# Patient Record
Sex: Male | Born: 1985 | Race: White | Hispanic: No | Marital: Single | Smoking: Never smoker
Health system: Southern US, Community
[De-identification: ages and names within clinical notes are randomized; demographics above are authoritative.]

## PROBLEM LIST (undated history)

## (undated) DIAGNOSIS — F329 Major depressive disorder, single episode, unspecified: Secondary | ICD-10-CM

## (undated) DIAGNOSIS — F419 Anxiety disorder, unspecified: Secondary | ICD-10-CM

## (undated) DIAGNOSIS — K219 Gastro-esophageal reflux disease without esophagitis: Secondary | ICD-10-CM

## (undated) DIAGNOSIS — F32A Depression, unspecified: Secondary | ICD-10-CM

## (undated) HISTORY — DX: Anxiety disorder, unspecified: F41.9

## (undated) HISTORY — DX: Major depressive disorder, single episode, unspecified: F32.9

## (undated) HISTORY — DX: Depression, unspecified: F32.A

## (undated) HISTORY — DX: Gastro-esophageal reflux disease without esophagitis: K21.9

---

## 2018-06-13 ENCOUNTER — Encounter: Payer: Self-pay | Admitting: Medical

## 2018-06-13 ENCOUNTER — Ambulatory Visit (INDEPENDENT_AMBULATORY_CARE_PROVIDER_SITE_OTHER): Payer: BLUE CROSS/BLUE SHIELD | Admitting: Medical

## 2018-06-13 VITALS — BP 140/70 | HR 75 | Temp 98.2°F | Resp 16 | Ht 66.0 in | Wt 135.4 lb

## 2018-06-13 DIAGNOSIS — F329 Major depressive disorder, single episode, unspecified: Secondary | ICD-10-CM

## 2018-06-13 DIAGNOSIS — F32A Depression, unspecified: Secondary | ICD-10-CM

## 2018-06-13 DIAGNOSIS — F419 Anxiety disorder, unspecified: Secondary | ICD-10-CM

## 2018-06-13 DIAGNOSIS — K219 Gastro-esophageal reflux disease without esophagitis: Secondary | ICD-10-CM | POA: Diagnosis not present

## 2018-06-13 MED ORDER — VENLAFAXINE HCL ER 37.5 MG PO CP24: 37.5000 mg | ORAL_CAPSULE | Freq: Every day | ORAL | 0 refills | Status: DC

## 2018-06-13 MED ORDER — BUSPIRONE HCL 15 MG PO TABS: 15.0000 mg | ORAL_TABLET | Freq: Two times a day (BID) | ORAL | 0 refills | Status: DC

## 2018-06-13 NOTE — Progress Notes (Signed)
Subjective:    Patient ID: Patrick Fernandez, male    DOB: July 30, 1986, 32 y.o.   MRN: [id number]  HPI  Pt in with friend of his. This if first.  Works at [employer], Does not exercise regularly. Pt 3-4 sodas a day. Non smoker. Pt admits not eating healthy.   Pt does not want flu vaccine.   Pt and his friend admit that he is extremely stressed, anxious and he is likely depressed. He used zoloft in the past in 2003. He used it very briefly and it did not help. He lost insurance years ago and had to stop. He states baseline always anxious but more constant high level past 2 months with new supervisor.  On review he does feel throat tighten when anxious but no allergy type symptoms associated on review.     Review of Systems  Constitutional: Negative for chills, fatigue and fever.  HENT:       Pt had two episodes of feeling like he had tight throat randomly. He thought might be related to stress. He has not been sob or wheezing.  Respiratory: Negative for cough, chest tightness, shortness of breath and wheezing.   Cardiovascular: Negative for chest pain and palpitations.  Gastrointestinal: Positive for abdominal pain.       Pt does have history of heart burn occasionally in the past. In past when eats would feel like something would come up in throat.  He is now on prilosec otc daily.  Genitourinary: Negative for dysuria.  Musculoskeletal: Negative for back pain.  Skin: Negative for rash.  Neurological: Negative for dizziness, speech difficulty, weakness, light-headedness, numbness and headaches.  Hematological: Negative for adenopathy. Does not bruise/bleed easily.  Psychiatric/Behavioral: Positive for dysphoric mood. Negative for behavioral problems, confusion, sleep disturbance and suicidal ideas. The patient is nervous/anxious.        Some stress at work. He worries about everything per friend.  Friend states that he does suffer from depression.   He admits that work stress is extreme  stress. 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  He states when he gets anxiety will feel like frog in his throat.   Past Medical History:  Diagnosis Date  . Anxiety   . Depression      Social History   Socioeconomic History  . Marital status: Single    Spouse name: Not on file  . Number of children: Not on file  . Years of education: Not on file  . Highest education level: Not on file  Occupational History  . Not on file  Social Needs  . Financial resource strain: Not on file  . Food insecurity:    Worry: Not on file    Inability: Not on file  . Transportation needs:    Medical: Not on file    Non-medical: Not on file  Tobacco Use  . Smoking status: Never Smoker  . Smokeless tobacco: Never Used  Substance and Sexual Activity  . Alcohol use: Never    Frequency: Never  . Drug use: Never  . Sexual activity: Not on file  Lifestyle  . Physical activity:    Days per week: Not on file    Minutes per session: Not on file  . Stress: Not on file  Relationships  . Social connections:    Talks on phone: Not on file    Gets together: Not on file    Attends religious service: Not on file    Active member of club or  organization: Not on file    Attends meetings of clubs or organizations: Not on file    Relationship status: Not on file  . Intimate partner violence:    Fear of current or ex partner: Not on file    Emotionally abused: Not on file    Physically abused: Not on file    Forced sexual activity: Not on file  Other Topics Concern  . Not on file  Social History Narrative  . Not on file      Family History  Problem Relation Age of Onset  . Stroke Mother   . Colon cancer Father   . Hypertension Father     Not on File  No current outpatient medications on file prior to visit.   No current facility-administered medications on file prior to visit.     BP (!) 154/100   Pulse 75   Temp 98.2 F (36.8 C) (Oral)   Resp 16   Ht 5\' 6"  (1.676 m)    Wt 135 lb 6.4 oz (61.4 kg)   SpO2 100%   BMI 21.85 kg/m       Objective:   Physical Exam  General Mental Status- Alert. General Appearance- Not in acute distress.   Skin General: Color- Normal Color. Moisture- Normal Moisture.  Neck Carotid Arteries- Normal color. Moisture- Normal Moisture. No carotid bruits. No JVD.  Chest and Lung Exam Auscultation: Breath Sounds:-Normal.  Cardiovascular Auscultation:Rythm- Regular. Murmurs & Other Heart Sounds:Auscultation of the heart reveals- No Murmurs.  Abdomen Inspection:-Inspeection Normal. Palpation/Percussion:Note:No mass. Palpation and Percussion of the abdomen reveal- Non Tender, Non Distended + BS, no rebound or guarding.    Neurologic Cranial Nerve exam:- CN III-XII intact(No nystagmus), symmetric smile. Strength:- 5/5 equal and symmetric strength both upper and lower extremities.      Assessment & Plan:  For anxiety and depression, I am prescribing effexor and making buspar available Recommend that you start with effexor and hold off on taking buspar initially. If still feeling some anxiety after a week can add buspar.  For gerd continue omeprazole. Recommend healthy diet and cutting back on sodas.  I don't think you are having allergy type symptoms/reaction but if you do get severe throat tighten sensation then recommend ED evaluation.  Follow up 12-14 days CPE or as needed  [employer] [employer]

## 2018-06-13 NOTE — Patient Instructions (Signed)
For anxiety and depression, I am prescribing effexor and making buspar available Recommend that you start with effexor and hold off on taking buspar initially. If still feeling some anxiety after a week can add buspar.  For gerd continue omeprazole. Recommend healthy diet and cutting back on sodas.  I don't think you are having allergy type symptoms/reaction but if you do get severe throat tighten sensation then recommend ED evaluation.  Follow up 12-14 days CPE or as needed

## 2018-06-16 ENCOUNTER — Ambulatory Visit: Payer: Self-pay

## 2018-06-16 NOTE — Telephone Encounter (Signed)
Returned call to pt.  C/o onset of feeling his heart racing and feeling tremors about 3:00 AM Sunday.  Stated he started new medication, Venlafaxine, on Saturday, and feels his symptoms are related to the side effects of this.  Stated he feels like his heart is settling down some at present time; "it's not beating as fast as it was." Requested pt. To check his pulse at this time, but he was unable to locate his pulse.  C/o feeling tremors.  Reported taking the Venlafaxine on Saturday and Sunday AM, but has not taken it this morning.  Denied any chest pain or shortness of breath.  C/o intermittent light-headed feeling, but said "I haven't been eating right for about 2 weeks."  Reported he has difficulty swallowing solid foods due to fear that he will choke.  Stated "the foods don't get stuck, but there is something in my esophagus that is affecting my swallowing."  Denied any difficulty swallowing liquids.  Reported he has panic attacks frequently, and has taken time off work for the next 5 days.  Stated "I don't feel like doing anything, and can only lay around.  Stated even though he is not working, he worries about a lot of other things.  Offered to schedule an appt. For further evaluation of anxiety and increased heart rate.  Pt. stated he will wait to see if Esperanza Richters has any further recommendations, before making another appt.  Care advice given per protocol.  Advised will send Triage note to Esperanza Richters for his review/ recommendations.  Pt. Verb. Understanding.  Agreed with plan.      Reason for Disposition . [1] Palpitations AND [2] no improvement after using CARE ADVICE  Answer Assessment - Initial Assessment Questions 1. DESCRIPTION: "Please describe your heart rate or heart beat that you are having" (e.g., fast/slow, regular/irregular, skipped or extra beats, "palpitations")     Feels like heart is beating fast  2. ONSET: "When did it start?" (Minutes, hours or days)      Sunday AM,  about 3:00 AM 3. DURATION: "How long does it last" (e.g., seconds, minutes, hours)     Varies  4. PATTERN "Does it come and go, or has it been constant since it started?"  "Does it get worse with exertion?"   "Are you feeling it now?"     Comes and goes; feels like it lasts continuously at night   5. TAP: "Using your hand, can you tap out what you are feeling on a chair or table in front of you, so that I can hear?" (Note: not all patients can do this)       Not able to  6. HEART RATE: "Can you tell me your heart rate?" "How many beats in 15 seconds?"  (Note: not all patients can do this)       Unable to find pulse to check it.  7. RECURRENT SYMPTOM: "Have you ever had this before?" If so, ask: "When was the last time?" and "What happened that time?"      "probably in the past when worried about going to work or something but it usually calms itself down.  8. CAUSE: "What do you think is causing the palpitations?"     Feels it is related to starting Venlafaxine 9. CARDIAC HISTORY: "Do you have any history of heart disease?" (e.g., heart attack, angina, bypass surgery, angioplasty, arrhythmia)      Denied any cardiac hx.  10. OTHER SYMPTOMS: "Do you have any  other symptoms?" (e.g., dizziness, chest pain, sweating, difficulty breathing)       C/o some light headedness; hasn't eaten well in 2 weeks due to worry/ anxiety; has panic attacks when he tries to eat due to fear of choking; reported he feels like he breaks out in a sweat with panic attacks;  denied chest pain or shortness of breath; stated he feels shaky intermittently  Protocols used: HEART RATE AND HEARTBEAT QUESTIONS-A-AH  Message from Gerrianne Scale sent at 06/16/2018 8:28 AM EDT   Pt calling stating that he is having side effects from the venlafaxine XR (EFFEXOR-XR) 37.5 MG 24 hr capsule Sunday and this morning pt states that he has been having racing heart and shaking that runs into the afternoon he started taking the medicine on  Saturday and want to know if he need to continue this medicine Call pt at [phone]

## 2018-06-24 ENCOUNTER — Encounter: Payer: Self-pay | Admitting: Medical

## 2018-06-24 ENCOUNTER — Ambulatory Visit (INDEPENDENT_AMBULATORY_CARE_PROVIDER_SITE_OTHER): Payer: BLUE CROSS/BLUE SHIELD | Admitting: Medical

## 2018-06-24 VITALS — BP 148/90 | HR 102 | Temp 98.7°F | Resp 16 | Ht 66.0 in | Wt 136.2 lb

## 2018-06-24 DIAGNOSIS — R000 Tachycardia, unspecified: Secondary | ICD-10-CM

## 2018-06-24 DIAGNOSIS — F419 Anxiety disorder, unspecified: Secondary | ICD-10-CM

## 2018-06-24 DIAGNOSIS — R1310 Dysphagia, unspecified: Secondary | ICD-10-CM

## 2018-06-24 DIAGNOSIS — F329 Major depressive disorder, single episode, unspecified: Secondary | ICD-10-CM | POA: Diagnosis not present

## 2018-06-24 DIAGNOSIS — F32A Depression, unspecified: Secondary | ICD-10-CM

## 2018-06-24 MED ORDER — OMEPRAZOLE 40 MG PO CPDR: 40.0000 mg | DELAYED_RELEASE_CAPSULE | Freq: Every day | ORAL | 0 refills | Status: DC

## 2018-06-24 MED ORDER — SERTRALINE HCL 25 MG PO TABS: 25.0000 mg | ORAL_TABLET | Freq: Every day | ORAL | 0 refills | Status: DC

## 2018-06-24 NOTE — Patient Instructions (Addendum)
Overall you describe your anxiety is still present but not as strong as previously.  Also some persisting depressed mood.  Unfortunately you did describe potential side effects to Effexor.  Effexor does affect to neurotransmitters and would recommend that we switch you to sertraline.  We will start with low-dose and if you feel improved mood with no side effects then consider increasing to a higher dose.  I did prescribe BuSpar last time but hold off presently until we establish how you feel with the sertraline.  You report some pain on swallowing with difficulty swallowing at times with certain foods.  This may be a variant of reflux symptoms.  I am increasing your omeprazole to 40mg  daily.  Rx sent to your pharmacy.  Hopefully with this and healthy diet does swelling symptoms will resolve.  If not we will refer you to GI MD for possible EGD.  For recent fast heart rate sensation event/possible palpitations, we did EKG today. Ekg review appears normal sinus rhythm with no acute abnormality.  Follow-up in 2 weeks or as needed.

## 2018-06-24 NOTE — Progress Notes (Signed)
Subjective:    Patient ID: Patrick Fernandez, male    DOB: [birth year], 32 y.o.   MRN: [id number]  HPI  Pt in for follow up.   Pt states after 2 days of being on effexor he states he felt like his heart was beating to fast. With his anxiety in the past he would get some rare fast heart rate sensation but this was more intense. Pt in the past benadryl would make his heart beat fast. He called and did stop the medication. He states still anxious but not as much as on last visit.  Pt never got buspar  Pt states he feels like he has difficulty swallowing certain foods. He tends to eat soups or salads and avoid larger things such as sub salad. Has to drink thinks to make things go down. This has been going on for about 2-3 weeks. First symptoms occurred after eating candy bar. Had brief difficulty eating Mr Lorie Apley. Pt has been taking the omeprazole.      Review of Systems  Constitutional: Negative for chills, fatigue and fever.  HENT:       See hpi.  Respiratory: Negative for cough, chest tightness, shortness of breath and wheezing.        See hpi.  Cardiovascular: Negative for chest pain and palpitations.       See hpi. No recurrent symptoms for more than a weekl stopped after he stopped effexor.  Gastrointestinal: Negative for abdominal pain.  Genitourinary: Negative for dysuria.  Musculoskeletal: Negative for back pain and neck pain.  Skin: Negative for rash.  Neurological: Negative for dizziness, speech difficulty, weakness and light-headedness.  Hematological: Negative for adenopathy. Does not bruise/bleed easily.  Psychiatric/Behavioral: Negative for behavioral problems, confusion, dysphoric mood and suicidal ideas. The patient is nervous/anxious.     Past Medical History:  Diagnosis Date  . Anxiety   . Depression   . GERD (gastroesophageal reflux disease)      Social History   Socioeconomic History  . Marital status: Single    Spouse name: Not on file  . Number of  children: Not on file  . Years of education: Not on file  . Highest education level: Not on file  Occupational History  . Not on file  Social Needs  . Financial resource strain: Not on file  . Food insecurity:    Worry: Not on file    Inability: Not on file  . Transportation needs:    Medical: Not on file    Non-medical: Not on file  Tobacco Use  . Smoking status: Never Smoker  . Smokeless tobacco: Never Used  Substance and Sexual Activity  . Alcohol use: Never    Frequency: Never  . Drug use: Never  . Sexual activity: Not on file  Lifestyle  . Physical activity:    Days per week: Not on file    Minutes per session: Not on file  . Stress: Not on file  Relationships  . Social connections:    Talks on phone: Not on file    Gets together: Not on file    Attends religious service: Not on file    Active member of club or organization: Not on file    Attends meetings of clubs or organizations: Not on file    Relationship status: Not on file  . Intimate partner violence:    Fear of current or ex partner: Not on file    Emotionally abused: Not on file    Physically abused:  Not on file    Forced sexual activity: Not on file  Other Topics Concern  . Not on file  Social History Narrative  . Not on file    No past surgical history on file.  Family History  Problem Relation Age of Onset  . Stroke Mother   . Colon cancer Father   . Hypertension Father     Not on File  Current Outpatient Medications on File Prior to Visit  Medication Sig Dispense Refill  . busPIRone (BUSPAR) 15 MG tablet Take 1 tablet (15 mg total) by mouth 2 (two) times daily. 60 tablet 0  . venlafaxine XR (EFFEXOR-XR) 37.5 MG 24 hr capsule Take 1 capsule (37.5 mg total) by mouth daily with breakfast. Please give extended release generic 14 capsule 0   No current facility-administered medications on file prior to visit.     BP (!) 148/90   Pulse (!) 102   Temp 98.7 F (37.1 C) (Oral)   Resp 16    Ht 5\' 6"  (1.676 m)   Wt 136 lb 3.2 oz (61.8 kg)   SpO2 100%   BMI 21.98 kg/m       Objective:   Physical Exam  General Mental Status- Alert. General Appearance- Not in acute distress.   Skin General: Color- Normal Color. Moisture- Normal Moisture.  Neck Carotid Arteries- Normal color. Moisture- Normal Moisture. No carotid bruits. No JVD.  Chest and Lung Exam Auscultation: Breath Sounds:-Normal.  Cardiovascular Auscultation:Rythm- Regular. Murmurs & Other Heart Sounds:Auscultation of the heart reveals- No Murmurs.  Abdomen Inspection:-Inspeection Normal. Palpation/Percussion:Note:No mass. Palpation and Percussion of the abdomen reveal- Non Tender, Non Distended + BS, no rebound or guarding.    Neurologic Cranial Nerve exam:- CN III-XII intact(No nystagmus), symmetric smile. Strength:- 5/5 equal and symmetric strength both upper and lower extremities.      Assessment & Plan:  Overall you describe your anxiety is still present but not as strong as previously.  Also some persisting depressed mood.  Unfortunately you did describe potential side effects to Effexor.  Effexor does affect to neurotransmitters and would recommend that we switch you to sertraline.  We will start with low-dose and if you feel improved mood with no side effects then consider increasing to a higher dose.  I did prescribe BuSpar last time but hold off presently until we establish how you feel with the sertraline.  You report some pain on swallowing with difficulty swallowing at times with certain foods.  This may be a variant of reflux symptoms.  I am increasing your omeprazole to 40mg  daily.  Rx sent to your pharmacy.  Hopefully with this and healthy diet does swelling symptoms will resolve.  If not we will refer you to GI MD for possible EGD.  For recent fast heart rate sensation event/possible palpitations, we did EKG today.Ekg review appears normal sinus rhythm with no acute abnormality.(ekg  interpretation mentioned nondiagnostic qrs abnormality in precoridal leads. I don't see obvious concern. Will follow clinically and if palpitation or tachycardia reoccur then refer for holter.  Follow-up in 2 weeks or as needed.  Esperanza Richters, PA-C

## 2018-07-08 ENCOUNTER — Ambulatory Visit (INDEPENDENT_AMBULATORY_CARE_PROVIDER_SITE_OTHER): Payer: BLUE CROSS/BLUE SHIELD | Admitting: Medical

## 2018-07-08 ENCOUNTER — Encounter: Payer: Self-pay | Admitting: Medical

## 2018-07-08 VITALS — BP 139/87 | HR 87 | Temp 98.1°F | Resp 16 | Ht 66.0 in | Wt 136.5 lb

## 2018-07-08 DIAGNOSIS — R000 Tachycardia, unspecified: Secondary | ICD-10-CM | POA: Diagnosis not present

## 2018-07-08 DIAGNOSIS — K219 Gastro-esophageal reflux disease without esophagitis: Secondary | ICD-10-CM | POA: Diagnosis not present

## 2018-07-08 DIAGNOSIS — F419 Anxiety disorder, unspecified: Secondary | ICD-10-CM

## 2018-07-08 MED ORDER — OMEPRAZOLE 40 MG PO CPDR: 40.0000 mg | DELAYED_RELEASE_CAPSULE | Freq: Every day | ORAL | 3 refills | Status: DC

## 2018-07-08 MED ORDER — BUSPIRONE HCL 15 MG PO TABS: 15.0000 mg | ORAL_TABLET | Freq: Two times a day (BID) | ORAL | 0 refills | Status: AC

## 2018-07-08 NOTE — Patient Instructions (Signed)
Your GERD symptoms are much improved with omeprazole.  I did send in refills of that today.  Still recommend eating healthy diet.  If your swallowing complaints are worsening then let me know and I would refer you to gastroenterologist.  If that were to persist then you might need EGD to evaluate size of esophagus.  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  You had side effects with SSRI/norepinephrine type anxiety/depression medication.  It appears that you do not eat anything presently.  However if your anxiety does return then would recommend that you get in with a counselor as medication side effects are limiting treatment options.  You have never tried BuSpar so you might go ahead and fill that prescription and have it on hand to use if needed.  No further recurrent palpitations or tachycardia reported.  This was probably related to anxiety.  Follow-up in 2 months or as needed.

## 2018-07-08 NOTE — Progress Notes (Signed)
Subjective:    Patient ID: Patrick Fernandez, male    DOB: 09-22-1985, 32 y.o.   MRN: [id number]  HPI  Pt in states he had reaction to sertraline. He states he would have very upset stomach. 10-15 minutes of upset stomach. One time felt nausea but he did not vomit.  Effexor in past caused trembling sensation/jittery sensation.  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  Pt never tried the buspirone.  Pt states his stomach is feeling a lot better with omeprazole. No longer having abdomen pain apart from upset stomach with sertraline. He has better appetitive.  Pt mention no recent palpitations or fast heart beat sensations.    Review of Systems  Constitutional: Negative for chills, fatigue and fever.  Respiratory: Negative for cough, chest tightness, shortness of breath and wheezing.   Cardiovascular: Negative for chest pain and palpitations.  Gastrointestinal: Negative for abdominal distention, abdominal pain, blood in stool, constipation and diarrhea.       Better.  Genitourinary: Negative for dysuria and flank pain.  Musculoskeletal: Negative for back pain.  Neurological: Negative for dizziness, seizures, weakness and light-headedness.  Hematological: Negative for adenopathy. Does not bruise/bleed easily.  Psychiatric/Behavioral: Negative for behavioral problems and confusion.    Past Medical History:  Diagnosis Date  . Anxiety   . Depression   . GERD (gastroesophageal reflux disease)      Social History   Socioeconomic History  . Marital status: Single    Spouse name: Not on file  . Number of children: Not on file  . Years of education: Not on file  . Highest education level: Not on file  Occupational History  . Not on file  Social Needs  . Financial resource strain: Not on file  . Food insecurity:    Worry: Not on file    Inability: Not on file  . Transportation needs:   Medical: Not on file    Non-medical: Not on file  Tobacco Use  . Smoking status: Never Smoker  . Smokeless tobacco: Never Used  Substance and Sexual Activity  . Alcohol use: Never    Frequency: Never  . Drug use: Never  . Sexual activity: Not on file  Lifestyle  . Physical activity:    Days per week: Not on file    Minutes per session: Not on file  . Stress: Not on file  Relationships  . Social connections:    Talks on phone: Not on file    Gets together: Not on file    Attends religious service: Not on file    Active member of club or organization: Not on file    Attends meetings of clubs or organizations: Not on file    Relationship status: Not on file  . Intimate partner violence:    Fear of current or ex partner: Not on file    Emotionally abused: Not on file    Physically abused: Not on file    Forced sexual activity: Not on file  Other Topics Concern  . Not on file  Social History Narrative  . Not on file    No past surgical history on file.  Family History  Problem Relation Age of Onset  . Stroke Mother   . Colon cancer Father   . Hypertension Father     No Known Allergies  Current Outpatient Medications on File Prior to Visit  Medication Sig Dispense Refill  .  busPIRone (BUSPAR) 15 MG tablet Take 1 tablet (15 mg total) by mouth 2 (two) times daily. (Patient not taking: Reported on 07/08/2018) 60 tablet 0   No current facility-administered medications on file prior to visit.     BP 139/87 (BP Location: Left Arm, Patient Position: Sitting, Cuff Size: Small)   Pulse 87   Temp 98.1 F (36.7 C) (Oral)   Resp 16   Ht 5\' 6"  (1.676 m)   Wt 136 lb 8 oz (61.9 kg)   SpO2 100%   BMI 22.03 kg/m       Objective:   Physical Exam  General Appearance- Not in acute distress.  HEENT Eyes- Scleraeral/Conjuntiva-bilat- Not Yellow. Mouth & Throat- Normal.  Chest and Lung Exam Auscultation: Breath sounds:-Normal. Adventitious sounds:- No Adventitious  sounds.  Cardiovascular Auscultation:Rythm - Regular. Heart Sounds -Normal heart sounds.  Abdomen Inspection:-Inspection Normal.  Palpation/Perucssion: Palpation and Percussion of the abdomen reveal- Non Tender, No Rebound tenderness, No rigidity(Guarding) and No Palpable abdominal masses.  Liver:-Normal.  Spleen:- Normal.   Back- no cva tenderness.      Assessment & Plan:  Your GERD symptoms are much improved with omeprazole.  I did send in refills of that today.  Still recommend eating healthy diet.  If your swallowing complaints are worsening then let me know and I would refer you to gastroenterologist.  If that were to persist then you might need EGD to evaluate size of esophagus.  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  You had side effects with SSRI/norepinephrine type anxiety/depression medication.  It appears that you do not eat anything presently.  However if your anxiety does return then would recommend that you get in with a counselor as medication side effects are limiting treatment options.  You have never tried BuSpar so you might go ahead and fill that prescription and have it on hand to use if needed.  No further recurrent palpitations or tachycardia reported.  This was probably related to anxiety.  Follow-up in 2 months or as needed.  Esperanza Richters, PA-C

## 2018-07-08 NOTE — Progress Notes (Signed)
Pre visit review using our clinic review tool, if applicable. No additional management support is needed unless otherwise documented below in the visit note. 

## 2018-10-14 ENCOUNTER — Ambulatory Visit (INDEPENDENT_AMBULATORY_CARE_PROVIDER_SITE_OTHER): Payer: BLUE CROSS/BLUE SHIELD | Admitting: Medical

## 2018-10-14 ENCOUNTER — Encounter: Payer: Self-pay | Admitting: Medical

## 2018-10-14 VITALS — BP 128/81 | HR 98 | Temp 98.1°F | Resp 16 | Ht 66.0 in | Wt 139.4 lb

## 2018-10-14 DIAGNOSIS — F419 Anxiety disorder, unspecified: Secondary | ICD-10-CM | POA: Diagnosis not present

## 2018-10-14 DIAGNOSIS — K219 Gastro-esophageal reflux disease without esophagitis: Secondary | ICD-10-CM | POA: Diagnosis not present

## 2018-10-14 MED ORDER — LORATADINE 10 MG PO TABS: 10.0000 mg | ORAL_TABLET | Freq: Every day | ORAL | 3 refills | Status: AC

## 2018-10-14 MED ORDER — FLUTICASONE PROPIONATE 50 MCG/ACT NA SUSP: 2.0000 | Freq: Every day | NASAL | 1 refills | Status: AC

## 2018-10-14 NOTE — Progress Notes (Signed)
Subjective:    Patient ID: Patrick Fernandez, male    DOB: 01-20-1986, 33 y.o.   MRN: [id number]  HPI  Pt in for follow up.  Pt states with omeprazole he states he did well with eating. But just recently he brief sensation of lump in his throat. He states able to swallow. But in October he reminds me in past when he had some potential reflux he felt like had difficulty swallowing. Pt states that he takes omeprazole about 90% or more of the time. Occasional will skip a day. Feels well most days.  Pt state his level of stress and anxiety was brief over the holidays at [employer]. But just recently last 2 weeks little more stress. Pt never tried buspar in the past. He has rx and was thinking about taking to see if it helped.   Review of Systems  Constitutional: Negative for chills, fatigue and fever.  HENT: Positive for postnasal drip. Negative for congestion, ear pain, mouth sores, rhinorrhea and sinus pain.        Possible based on description of mucus in back of throat.  Respiratory: Negative for cough, chest tightness, shortness of breath and wheezing.   Cardiovascular: Negative for chest pain and palpitations.  Gastrointestinal:       See hpi.  Musculoskeletal: Negative for back pain, joint swelling and neck stiffness.  Skin: Negative for pallor and rash.  Neurological: Negative for dizziness, speech difficulty, weakness and light-headedness.  Hematological: Negative for adenopathy. Does not bruise/bleed easily.  Psychiatric/Behavioral: Negative for agitation, confusion, dysphoric mood and suicidal ideas. The patient is nervous/anxious.    Past Medical History:  Diagnosis Date  . Anxiety   . Depression   . GERD (gastroesophageal reflux disease)      Social History   Socioeconomic History  . Marital status: Single    Spouse name: Not on file  . Number of children: Not on file  . Years of education: Not on file  . Highest education level: Not on file  Occupational History  .  Not on file  Social Needs  . Financial resource strain: Not on file  . Food insecurity:    Worry: Not on file    Inability: Not on file  . Transportation needs:    Medical: Not on file    Non-medical: Not on file  Tobacco Use  . Smoking status: Never Smoker  . Smokeless tobacco: Never Used  Substance and Sexual Activity  . Alcohol use: Never    Frequency: Never  . Drug use: Never  . Sexual activity: Not on file  Lifestyle  . Physical activity:    Days per week: Not on file    Minutes per session: Not on file  . Stress: Not on file  Relationships  . Social connections:    Talks on phone: Not on file    Gets together: Not on file    Attends religious service: Not on file    Active member of club or organization: Not on file    Attends meetings of clubs or organizations: Not on file    Relationship status: Not on file  . Intimate partner violence:    Fear of current or ex partner: Not on file    Emotionally abused: Not on file    Physically abused: Not on file    Forced sexual activity: Not on file  Other Topics Concern  . Not on file  Social History Narrative  . Not on file  No past surgical history on file.  Family History  Problem Relation Age of Onset  . Stroke Mother   . Colon cancer Father   . Hypertension Father     No Known Allergies  Current Outpatient Medications on File Prior to Visit  Medication Sig Dispense Refill  . omeprazole (PRILOSEC) 40 MG capsule Take 1 capsule (40 mg total) by mouth daily. 30 capsule 3  . busPIRone (BUSPAR) 15 MG tablet Take 1 tablet (15 mg total) by mouth 2 (two) times daily. (Patient not taking: Reported on 10/14/2018) 60 tablet 0   No current facility-administered medications on file prior to visit.     BP 128/81   Pulse 98   Temp 98.1 F (36.7 C) (Oral)   Resp 16   Ht 5\' 6"  (1.676 m)   Wt 139 lb 6.4 oz (63.2 kg)   SpO2 100%   BMI 22.50 kg/m       Objective:   Physical Exam   General Appearance- Not in  acute distress.    Neck- normal. No thyromegaly.  HEENT Eyes- Scleraeral/Conjuntiva-bilat- Not Yellow. Mouth & Throat- Normal.  Chest and Lung Exam Auscultation: Breath sounds:-Normal. Adventitious sounds:- No Adventitious sounds.  Cardiovascular Auscultation:Rythm - Regular. Heart Sounds -Normal heart sounds.  Abdomen Inspection:-Inspection Normal.  Palpation/Perucssion: Palpation and Percussion of the abdomen reveal- Non Tender, No Rebound tenderness, No rigidity(Guarding) and No Palpable abdominal masses.  Liver:-Normal.  Spleen:- Normal.   Back- no cva tenderness..     Assessment & Plan:  You do have hx of gerd controlled on omeprazole. Recent recurrent sensation of lump in throat. Maybe reflux. So eat healthy and add famotidine/pepcid over the counter.   Also recommend start buspar daily.  With above want to see if your lump in throat sensation clears as by description and history your symptoms appear to occur with reflux and anxiety.  If symptoms persist then refer to GI MD. You may benefit from EGD.  Follow up in 2 weeks or as needed  Possible based on heent complaints. Rx claritin and flonase sent to pt pharmacy.

## 2018-10-14 NOTE — Patient Instructions (Addendum)
You do have hx of gerd controlled on omeprazole. Recent recurrent sensation of lump in throat. Maybe reflux. So eat healthy and add famotidine/pepcid over the counter.   Also recommend start buspar daily.  With above want to see if your lump in throat sensation clears as by description and history your symptoms appear to occur with reflux and anxiety.  If symptoms persist then refer to GI MD. You may benefit from EGD.  Follow up in 2 weeks or as needed

## 2018-10-28 ENCOUNTER — Encounter: Payer: Self-pay | Admitting: Medical

## 2018-10-28 ENCOUNTER — Ambulatory Visit (INDEPENDENT_AMBULATORY_CARE_PROVIDER_SITE_OTHER): Payer: BLUE CROSS/BLUE SHIELD | Admitting: Medical

## 2018-10-28 VITALS — BP 140/84 | HR 106 | Temp 98.2°F | Resp 16 | Ht 66.0 in | Wt 140.8 lb

## 2018-10-28 DIAGNOSIS — K219 Gastro-esophageal reflux disease without esophagitis: Secondary | ICD-10-CM | POA: Diagnosis not present

## 2018-10-28 DIAGNOSIS — F419 Anxiety disorder, unspecified: Secondary | ICD-10-CM

## 2018-10-28 NOTE — Progress Notes (Signed)
Subjective:    Patient ID: Patrick Fernandez, male    DOB: 05-03-1986, 33 y.o.   MRN: [id number]  HPI  Pt in for follow up.  Pt is using omeprazole daily. He never added famotidine to treatment as I advised. But he states he will add that to regimen. He admits to not eating too healthy. He drinking sodas again. Still slight occasional tight sensation in esophagus.  Pt states he stopped using buspar. He stated he got dizziness about one hour later. But no other symptom. He speculates dose was too strong. He took for anxiety.   Review of Systems  Constitutional: Negative for chills, fatigue and fever.  HENT: Negative for congestion.   Respiratory: Negative for chest tightness and wheezing.   Cardiovascular: Negative for chest pain and palpitations.  Gastrointestinal: Negative for abdominal distention, abdominal pain, constipation, diarrhea and rectal pain.  Genitourinary: Negative for dysuria.  Musculoskeletal: Negative for back pain.  Skin: Negative for rash.  Neurological: Negative for dizziness.  Hematological: Negative for adenopathy. Does not bruise/bleed easily.  Psychiatric/Behavioral: Negative for behavioral problems, confusion, dysphoric mood, sleep disturbance and suicidal ideas. The patient is nervous/anxious.     Past Medical History:  Diagnosis Date  . Anxiety   . Depression   . GERD (gastroesophageal reflux disease)      Social History   Socioeconomic History  . Marital status: Single    Spouse name: Not on file  . Number of children: Not on file  . Years of education: Not on file  . Highest education level: Not on file  Occupational History  . Not on file  Social Needs  . Financial resource strain: Not on file  . Food insecurity:    Worry: Not on file    Inability: Not on file  . Transportation needs:    Medical: Not on file    Non-medical: Not on file  Tobacco Use  . Smoking status: Never Smoker  . Smokeless tobacco: Never Used  Substance and Sexual  Activity  . Alcohol use: Never    Frequency: Never  . Drug use: Never  . Sexual activity: Not on file  Lifestyle  . Physical activity:    Days per week: Not on file    Minutes per session: Not on file  . Stress: Not on file  Relationships  . Social connections:    Talks on phone: Not on file    Gets together: Not on file    Attends religious service: Not on file    Active member of club or organization: Not on file    Attends meetings of clubs or organizations: Not on file    Relationship status: Not on file  . Intimate partner violence:    Fear of current or ex partner: Not on file    Emotionally abused: Not on file    Physically abused: Not on file    Forced sexual activity: Not on file  Other Topics Concern  . Not on file  Social History Narrative  . Not on file    No past surgical history on file.  Family History  Problem Relation Age of Onset  . Stroke Mother   . Colon cancer Father   . Hypertension Father     No Known Allergies  Current Outpatient Medications on File Prior to Visit  Medication Sig Dispense Refill  . busPIRone (BUSPAR) 15 MG tablet Take 1 tablet (15 mg total) by mouth 2 (two) times daily. 60 tablet 0  .  fluticasone (FLONASE) 50 MCG/ACT nasal spray Place 2 sprays into both nostrils daily. 16 g 1  . loratadine (CLARITIN) 10 MG tablet Take 1 tablet (10 mg total) by mouth daily. 30 tablet 3  . omeprazole (PRILOSEC) 40 MG capsule Take 1 capsule (40 mg total) by mouth daily. 30 capsule 3   No current facility-administered medications on file prior to visit.     BP 140/84   Pulse (!) 106   Temp 98.2 F (36.8 C) (Oral)   Resp 16   Ht 5\' 6"  (1.676 m)   Wt 140 lb 12.8 oz (63.9 kg)   SpO2 100%   BMI 22.73 kg/m       Objective:   Physical Exam  General Appearance- Not in acute distress.  HEENT Eyes- Scleraeral/Conjuntiva-bilat- Not Yellow. Mouth & Throat- Normal.  Chest and Lung Exam Auscultation: Breath sounds:-Normal. Adventitious  sounds:- No Adventitious sounds.  Cardiovascular Auscultation:Rythm - Regular. Heart Sounds -Normal heart sounds.  Abdomen Inspection:-Inspection Normal.  Palpation/Perucssion: Palpation and Percussion of the abdomen reveal- Non Tender, No Rebound tenderness, No rigidity(Guarding) and No Palpable abdominal masses.  Liver:-Normal.  Spleen:- Normal.   Back- no cva tenderness.      Assessment & Plan:  For gerd symptoms still advise continue omeprazole, healthy diet, stop sodas and add famotidine.   For anxiety, recommend trying half dose of buspar/7.5 mg and see if this helps.   Update me on how you feel with adding famotidine and using lower dose buspar.  Follow up in 3-6 months or as needed  [employer] [employer]

## 2018-10-28 NOTE — Patient Instructions (Signed)
For gerd symptoms still advise continue omeprazole, healthy diet, stop sodas and add famotidine.   For anxiety, recommend trying half dose of buspar/7.5 mg and see if this helps.   Update me on how you feel with adding famotidine and using lower dose buspar.  Follow up in 3-6 months or as needed

## 2018-12-21 ENCOUNTER — Other Ambulatory Visit: Payer: Self-pay | Admitting: Medical

## 2019-02-04 ENCOUNTER — Other Ambulatory Visit: Payer: Self-pay | Admitting: Medical

## 2019-03-20 ENCOUNTER — Other Ambulatory Visit: Payer: Self-pay | Admitting: Medical

## 2019-04-27 ENCOUNTER — Other Ambulatory Visit: Payer: Self-pay

## 2019-04-28 ENCOUNTER — Ambulatory Visit (INDEPENDENT_AMBULATORY_CARE_PROVIDER_SITE_OTHER): Payer: BC Managed Care – PPO | Admitting: Medical

## 2019-04-28 ENCOUNTER — Encounter: Payer: Self-pay | Admitting: Medical

## 2019-04-28 VITALS — BP 130/77 | HR 87 | Temp 97.9°F | Resp 16 | Ht 66.0 in | Wt 136.6 lb

## 2019-04-28 DIAGNOSIS — F329 Major depressive disorder, single episode, unspecified: Secondary | ICD-10-CM | POA: Diagnosis not present

## 2019-04-28 DIAGNOSIS — F419 Anxiety disorder, unspecified: Secondary | ICD-10-CM

## 2019-04-28 DIAGNOSIS — K219 Gastro-esophageal reflux disease without esophagitis: Secondary | ICD-10-CM

## 2019-04-28 DIAGNOSIS — F32A Depression, unspecified: Secondary | ICD-10-CM

## 2019-04-28 MED ORDER — OMEPRAZOLE 40 MG PO CPDR: 40.0000 mg | DELAYED_RELEASE_CAPSULE | Freq: Every day | ORAL | 3 refills | Status: AC

## 2019-04-28 MED ORDER — FLUOXETINE HCL 10 MG PO CAPS: 10.0000 mg | ORAL_CAPSULE | Freq: Every day | ORAL | 0 refills | Status: DC

## 2019-04-28 NOTE — Progress Notes (Signed)
Subjective:    Patient ID: Patrick Fernandez, male    DOB: 08/02/1986, 33 y.o.   MRN: [id number]  HPI  Pt in for follow up.  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 Pt states he cut dose of buspar in half. He thinks helped his anxiety but he was anger. Some decreased mood. Pt has used some ssri in past. He thinks sertraline and effexor made him feel queezy and nausea.  Pt states his stomach feels of and on gasy. In the past he had some reflux symptoms high up in throat. He took omeprazole and it resolved upper esophagus symptoms. Gasy sensation on and off after he eats lunch at work. But when he is at home has no symptoms. Pt tried gas-x in past and did not help symptoms completely.    Review of Systems  Constitutional: Negative for chills, fatigue and unexpected weight change.  Respiratory: Negative for chest tightness, wheezing and stridor.   Cardiovascular: Negative for chest pain and palpitations.  Gastrointestinal: Negative for abdominal pain.  Musculoskeletal: Negative for back pain and myalgias.  Skin: Negative for rash.  Hematological: Negative for adenopathy. Does not bruise/bleed easily.  Psychiatric/Behavioral: Positive for dysphoric mood. Negative for behavioral problems, confusion, sleep disturbance and suicidal ideas. The patient is nervous/anxious.        Appears work stress related.    Past Medical History:  Diagnosis Date  . Anxiety   . Depression   . GERD (gastroesophageal reflux disease)      Social History   Socioeconomic History  . Marital status: Single    Spouse name: Not on file  . Number of children: Not on file  . Years of education: Not on file  . Highest education level: Not on file  Occupational History  . Not on file  Social Needs  . Financial resource strain: Not on file  . Food insecurity    Worry: Not on file    Inability: Not on file  . Transportation needs   Medical: Not on file    Non-medical: Not on file  Tobacco Use  . Smoking status: Never Smoker  . Smokeless tobacco: Never Used  Substance and Sexual Activity  . Alcohol use: Never    Frequency: Never  . Drug use: Never  . Sexual activity: Not on file  Lifestyle  . Physical activity    Days per week: Not on file    Minutes per session: Not on file  . Stress: Not on file  Relationships  . Social Herbalist on phone: Not on file    Gets together: Not on file    Attends religious service: Not on file    Active member of club or organization: Not on file    Attends meetings of clubs or organizations: Not on file    Relationship status: Not on file  . Intimate partner violence    Fear of current or ex partner: Not on file    Emotionally abused: Not on file    Physically abused: Not on file    Forced sexual activity: Not on file  Other Topics Concern  . Not on file  Social History Narrative  . Not on file    No past surgical history on file.  Family History  Problem Relation Age of Onset  . Stroke Mother   . Colon cancer Father   . Hypertension  Father     No Known Allergies  Current Outpatient Medications on File Prior to Visit  Medication Sig Dispense Refill  . busPIRone (BUSPAR) 15 MG tablet Take 1 tablet (15 mg total) by mouth 2 (two) times daily. 60 tablet 0  . fluticasone (FLONASE) 50 MCG/ACT nasal spray Place 2 sprays into both nostrils daily. 16 g 1  . loratadine (CLARITIN) 10 MG tablet Take 1 tablet (10 mg total) by mouth daily. 30 tablet 3  . omeprazole (PRILOSEC) 40 MG capsule Take 1 capsule by mouth once daily 30 capsule 0   No current facility-administered medications on file prior to visit.     BP 130/77   Pulse 87   Temp 97.9 F (36.6 C) (Temporal)   Resp 16   Ht 5\' 6"  (1.676 m)   Wt 136 lb 9.6 oz (62 kg)   SpO2 100%   BMI 22.05 kg/m       Objective:   Physical Exam  General Mental Status- Alert. General Appearance- Not in acute  distress.   Skin General: Color- Normal Color. Moisture- Normal Moisture.  Neck Carotid Arteries- Normal color. Moisture- Normal Moisture. No carotid bruits. No JVD.  Chest and Lung Exam Auscultation: Breath Sounds:-Normal.  Cardiovascular Auscultation:Rythm- Regular. Murmurs & Other Heart Sounds:Auscultation of the heart reveals- No Murmurs.  Abdomen Inspection:-Inspeection Normal. Palpation/Percussion:Note:No mass. Palpation and Percussion of the abdomen reveal- Non Tender, Non Distended + BS, no rebound or guarding.   Neurologic Cranial Nerve exam:- CN III-XII intact(No nystagmus), symmetric smile. Strength:- 5/5 equal and symmetric strength both upper and lower extremities.      Assessment & Plan:  For decreased mood and anxiety, I prescribed low dose Prozac today and can continue 1/2 tab of Buspar.   For gerd controlled with omeprazole, I refilled omeprazole.  For gassiness associated with eating subway, recommend do trial of eating other foods for 2-3 days at lunch. If still gassy then try max dose of gas-x or phazyme.  Follow up 2 weeks virtual phone or video.  25 minutes spent with pt. 50% of times spent counseling on dx and tx plan. Esperanza RichtersEdward Zell Hylton, PA-C

## 2019-04-28 NOTE — Patient Instructions (Addendum)
For decreased mood and anxiety, I prescribed low dose Prozac today and can continue 1/2 tab of Buspar.   For gerd controlled with omeprazole, I refilled omeprazole.  For gassiness associated with eating subway, recommend do trial of eating other foods for 2-3 days at lunch. If still gassy then try max dose of gas-x or phazyme.  Offered and educated pt on flu vaccine today. Pt declined presently.  Follow up 2 weeks virtual phone or video.

## 2019-05-12 ENCOUNTER — Other Ambulatory Visit: Payer: Self-pay

## 2019-05-12 ENCOUNTER — Encounter: Payer: Self-pay | Admitting: Medical

## 2019-05-12 ENCOUNTER — Ambulatory Visit (INDEPENDENT_AMBULATORY_CARE_PROVIDER_SITE_OTHER): Payer: BC Managed Care – PPO | Admitting: Medical

## 2019-05-12 DIAGNOSIS — F419 Anxiety disorder, unspecified: Secondary | ICD-10-CM

## 2019-05-12 DIAGNOSIS — F329 Major depressive disorder, single episode, unspecified: Secondary | ICD-10-CM

## 2019-05-12 DIAGNOSIS — F32A Depression, unspecified: Secondary | ICD-10-CM

## 2019-05-12 MED ORDER — BUPROPION HCL 75 MG PO TABS: 75.0000 mg | ORAL_TABLET | Freq: Two times a day (BID) | ORAL | 0 refills | Status: AC

## 2019-05-12 NOTE — Patient Instructions (Signed)
For anxiety and some periodic depressed mood, will have you stop prozac due to sexual side effects. At this point, I want you to just use 1/2 tablet of BuSpar up to twice daily if needed for your anxiety.  Recommend you try this over the next 7 to 10 days and you might add Wellbutrin low-dose to regimen if needed.  If you do have side effects with these medications then would recommend that you call your insurance company to discuss if counseling services covered as you have had various side effects to different medications.  I think you benefit from downloading my chart she could send me updates and review labs.  Follow-up in 10 to 14 days by my chart or as needed.

## 2019-05-12 NOTE — Progress Notes (Signed)
Subjective:    Patient ID: Patrick Fernandez, male    DOB: [birth year], 33 y.o.   MRN: [id number]  HPI  Virtual Visit via Video Note  I connected with [name] on 05/12/19 at  2:00 PM EDT by a video enabled telemedicine application and verified that I am speaking with the correct person using two identifiers.  Location: Patient: home Provider: office   I discussed the limitations of evaluation and management by telemedicine and the availability of in person appointments. The patient expressed understanding and agreed to proceed.  History of Present Illness:  Pt states he had some sexual side effects with prozac. Indicates decreased libido and ED. He stopped med on wed night. Pt states with effexor and sertraline had dizzisiness so he stopped those  quickly.  Pt states did not take buspar since last visit. I had advised just use 1/2 tab due to his hx of side effects.  He is using meds for anxiety related to work. Usually his anxiety is worst at beginning of his shift.  His mood has improved some with friend at work.    Observations/Objective:  General-no acute distress, pleasant, oriented. Lungs- on inspection lungs appear unlabored. Neck- no tracheal deviation or jvd on inspection. Neuro- gross motor function appears intact.  Assessment and Plan: For anxiety and some periodic depressed mood, will have you stop prozac due to sexual side effects. At this point, I want you to just use 1/2 tablet of BuSpar up to twice daily if needed for your anxiety.  Recommend you try this over the next 7 to 10 days and you might add Wellbutrin low-dose to regimen if needed.  If you do have side effects with these medications then would recommend that you call your insurance company to discuss if counseling services covered as you have had various side effects to different medications.  I think you benefit from downloading my chart she could send me updates and review labs.  Follow-up in 10 to 14  days by my chart or as needed.  [name], PA-C  Follow Up Instructions:    I discussed the assessment and treatment plan with the patient. The patient was provided an opportunity to ask questions and all were answered. The patient agreed with the plan and demonstrated an understanding of the instructions.   The patient was advised to call back or seek an in-person evaluation if the symptoms worsen or if the condition fails to improve as anticipated.  I provided  25 minutes of non-face-to-face time during this encounter.   [name], PA-C    Review of Systems  Constitutional: Negative for chills, fatigue and fever.  Respiratory: Negative for cough, chest tightness, shortness of breath and wheezing.   Cardiovascular: Negative for chest pain and palpitations.  Gastrointestinal: Negative for abdominal pain, blood in stool, nausea and vomiting.  Genitourinary: Negative for flank pain and frequency.  Musculoskeletal: Negative for back pain, joint swelling and neck pain.  Skin: Negative for rash.  Neurological: Negative for dizziness and light-headedness.  Hematological: Negative for adenopathy. Does not bruise/bleed easily.  Psychiatric/Behavioral: Positive for dysphoric mood. Negative for behavioral problems, decreased concentration, hallucinations, sleep disturbance and suicidal ideas. The patient is nervous/anxious.    Past Medical History:  Diagnosis Date  . Anxiety   . Depression   . GERD (gastroesophageal reflux disease)      Social History   Socioeconomic History  . Marital status: Single    Spouse name: Not on file  . Number  of children: Not on file  . Years of education: Not on file  . Highest education level: Not on file  Occupational History  . Not on file  Social Needs  . Financial resource strain: Not on file  . Food insecurity    Worry: Not on file    Inability: Not on file  . Transportation needs    Medical: Not on file    Non-medical: Not on  file  Tobacco Use  . Smoking status: Never Smoker  . Smokeless tobacco: Never Used  Substance and Sexual Activity  . Alcohol use: Never    Frequency: Never  . Drug use: Never  . Sexual activity: Not on file  Lifestyle  . Physical activity    Days per week: Not on file    Minutes per session: Not on file  . Stress: Not on file  Relationships  . Social Musicianconnections    Talks on phone: Not on file    Gets together: Not on file    Attends religious service: Not on file    Active member of club or organization: Not on file    Attends meetings of clubs or organizations: Not on file    Relationship status: Not on file  . Intimate partner violence    Fear of current or ex partner: Not on file    Emotionally abused: Not on file    Physically abused: Not on file    Forced sexual activity: Not on file  Other Topics Concern  . Not on file  Social History Narrative  . Not on file    No past surgical history on file.  Family History  Problem Relation Age of Onset  . Stroke Mother   . Colon cancer Father   . Hypertension Father     No Known Allergies  Current Outpatient Medications on File Prior to Visit  Medication Sig Dispense Refill  . busPIRone (BUSPAR) 15 MG tablet Take 1 tablet (15 mg total) by mouth 2 (two) times daily. 60 tablet 0  . fluticasone (FLONASE) 50 MCG/ACT nasal spray Place 2 sprays into both nostrils daily. 16 g 1  . loratadine (CLARITIN) 10 MG tablet Take 1 tablet (10 mg total) by mouth daily. 30 tablet 3  . omeprazole (PRILOSEC) 40 MG capsule Take 1 capsule (40 mg total) by mouth daily. 90 capsule 3   No current facility-administered medications on file prior to visit.     There were no vitals taken for this visit.      Objective:   Physical Exam        Assessment & Plan:

## 2019-05-26 ENCOUNTER — Encounter: Payer: Self-pay | Admitting: Medical

## 2019-07-02 ENCOUNTER — Other Ambulatory Visit: Payer: Self-pay

## 2019-07-03 ENCOUNTER — Ambulatory Visit (HOSPITAL_BASED_OUTPATIENT_CLINIC_OR_DEPARTMENT_OTHER)
Admission: RE | Admit: 2019-07-03 | Discharge: 2019-07-03 | Disposition: A | Discharge status: Home / Self Care | Payer: BC Managed Care – PPO | Source: Ambulatory Visit | Attending: Medical | Admitting: Medical

## 2019-07-03 ENCOUNTER — Encounter: Payer: Self-pay | Admitting: Medical

## 2019-07-03 ENCOUNTER — Ambulatory Visit (INDEPENDENT_AMBULATORY_CARE_PROVIDER_SITE_OTHER): Payer: BC Managed Care – PPO | Admitting: Medical

## 2019-07-03 ENCOUNTER — Other Ambulatory Visit: Payer: Self-pay

## 2019-07-03 VITALS — BP 130/80

## 2019-07-03 DIAGNOSIS — M79601 Pain in right arm: Secondary | ICD-10-CM

## 2019-07-03 DIAGNOSIS — M25511 Pain in right shoulder: Secondary | ICD-10-CM | POA: Diagnosis not present

## 2019-07-03 DIAGNOSIS — R109 Unspecified abdominal pain: Secondary | ICD-10-CM | POA: Diagnosis not present

## 2019-07-03 DIAGNOSIS — N432 Other hydrocele: Secondary | ICD-10-CM | POA: Diagnosis not present

## 2019-07-03 DIAGNOSIS — R1032 Left lower quadrant pain: Secondary | ICD-10-CM

## 2019-07-03 MED ORDER — MELOXICAM 7.5 MG PO TABS: 7.5000 mg | ORAL_TABLET | Freq: Every day | ORAL | 0 refills | Status: AC

## 2019-07-03 MED ORDER — FAMOTIDINE 20 MG PO TABS: 20.0000 mg | ORAL_TABLET | Freq: Every day | ORAL | 0 refills | Status: AC

## 2019-07-03 MED ORDER — TRAMADOL HCL 50 MG PO TABS: 50.0000 mg | ORAL_TABLET | Freq: Four times a day (QID) | ORAL | 0 refills | Status: AC | PRN

## 2019-07-03 NOTE — Progress Notes (Signed)
Subjective:    Patient ID: Patrick Fernandez, male    DOB: 03-Nov-1985, 33 y.o.   MRN: [id number]  HPI  Pt in for some abdomen pain. He states random abdomen pain. He states pain most of the time occurs at work. Pain occurs around belly button then radiates to flak area at time and sometimes radiates toward epigastric area.  Some gerd symptoms in past. Pt has been on omeprazole. He states does not think pain is reflux.  Pt also reports for 5 years or more he has had left side groin area lump.  States he had to leave work at times due to pain. Sometimes when he lays down gets relief. Sitting and leaning forward helps stomach.  Pt thinks maybe some association to anxiety as he does report work anxiety. But then explains other day blowing leaves and felt pain.  2015 he had similar pain.  Pt thought may wellbutrin side effect but then he stopped wellbutrin for 2 weeks and made no difference.  Pt also notes some rt arm pain. He points to rt tricep posterior shoulder region. Pain minimal now. No fall or trauma. The arm is not swollen. Pain more at night. Pt tried ibuprofen, biofreeze and cold compress. Off and on pain for 2 weeks. Worse now. He states hand held massager seems to help pain.    Review of Systems  Constitutional: Negative for chills, fatigue and fever.  Respiratory: Negative for cough, chest tightness, shortness of breath and wheezing.   Cardiovascular: Negative for chest pain and palpitations.  Gastrointestinal: Positive for abdominal pain. Negative for abdominal distention, diarrhea, nausea, rectal pain and vomiting.  Musculoskeletal: Negative for neck pain.       Rt shoulder. Rt tricep area pain.   Left groin area lump or swelling per pt.  Neurological: Negative for dizziness, weakness, numbness and headaches.  Hematological: Negative for adenopathy. Does not bruise/bleed easily.  Psychiatric/Behavioral: Negative for behavioral problems, decreased concentration and dysphoric  mood.   Past Medical History:  Diagnosis Date  . Anxiety   . Depression   . GERD (gastroesophageal reflux disease)      Social History   Socioeconomic History  . Marital status: Single    Spouse name: Not on file  . Number of children: Not on file  . Years of education: Not on file  . Highest education level: Not on file  Occupational History  . Not on file  Social Needs  . Financial resource strain: Not on file  . Food insecurity    Worry: Not on file    Inability: Not on file  . Transportation needs    Medical: Not on file    Non-medical: Not on file  Tobacco Use  . Smoking status: Never Smoker  . Smokeless tobacco: Never Used  Substance and Sexual Activity  . Alcohol use: Never    Frequency: Never  . Drug use: Never  . Sexual activity: Not on file  Lifestyle  . Physical activity    Days per week: Not on file    Minutes per session: Not on file  . Stress: Not on file  Relationships  . Social Herbalist on phone: Not on file    Gets together: Not on file    Attends religious service: Not on file    Active member of club or organization: Not on file    Attends meetings of clubs or organizations: Not on file    Relationship status: Not on file  .  Intimate partner violence    Fear of current or ex partner: Not on file    Emotionally abused: Not on file    Physically abused: Not on file    Forced sexual activity: Not on file  Other Topics Concern  . Not on file  Social History Narrative  . Not on file    No past surgical history on file.  Family History  Problem Relation Age of Onset  . Stroke Mother   . Colon cancer Father   . Hypertension Father     No Known Allergies  Current Outpatient Medications on File Prior to Visit  Medication Sig Dispense Refill  . buPROPion (WELLBUTRIN) 75 MG tablet Take 1 tablet (75 mg total) by mouth 2 (two) times daily. 60 tablet 0  . busPIRone (BUSPAR) 15 MG tablet Take 1 tablet (15 mg total) by mouth 2  (two) times daily. 60 tablet 0  . fluticasone (FLONASE) 50 MCG/ACT nasal spray Place 2 sprays into both nostrils daily. 16 g 1  . loratadine (CLARITIN) 10 MG tablet Take 1 tablet (10 mg total) by mouth daily. 30 tablet 3  . omeprazole (PRILOSEC) 40 MG capsule Take 1 capsule (40 mg total) by mouth daily. 90 capsule 3   No current facility-administered medications on file prior to visit.     There were no vitals taken for this visit.      Objective:   Physical Exam  General Mental Status- Alert. General Appearance- Not in acute distress.   Skin General: Color- Normal Color. Moisture- Normal Moisture.  Neck Carotid Arteries- Normal color. Moisture- Normal Moisture. No carotid bruits. No JVD.  Chest and Lung Exam Auscultation: Breath Sounds:-Normal.  Cardiovascular Auscultation:Rythm- Regular. Murmurs & Other Heart Sounds:Auscultation of the heart reveals- No Murmurs.  Abdomen Inspection:-Inspeection Normal. Palpation/Percussion:Note:No mass. Palpation and Percussion of the abdomen reveal- Non Tender, Non Distended + BS, no rebound or guarding.  Neurologic Cranial Nerve exam:- CN III-XII intact(No nystagmus), symmetric smile. Strength:- 5/5 equal and symmetric strength both upper and lower extremities.  Genital exam- Apparent hydrocele but also may have inguinal hernia. Large enough that can't access inguinal canal to check..  Rt shoulder- good rom. No crepitus. Pain posterior side/upper arm junction near upper tricep.  Skin- no rash on shoulder or abd (per pt)      Assessment & Plan:  For hx of gerd and recent abdomen pain will get cbc and cmp today.  Continue omeprazole and will add famotadine to see if this helps.  You do have probable hydrocele and possible hernia. Will [place] of scrotum today or tomorrow at latest. Will refer to urologist.  If any severe abdomen pain, scrotum pain or groin pain then ED evaluation.  For rt shoulder pain/tricep area pain will get  shoulder xray. Rx meloixicam 7. 5 mg daily. Short course tramadol to use if needed. Rx advisement.  Follow up 7 days or as needed  25+ minutes spent with pt. 50% of time spent counseling pt on plan going forward.   Pt was offered [place] today at 6:30 but he declined so scheduled for Tuesday. Will refer after reviewing [place]. Esperanza Richters, PA-C

## 2019-07-03 NOTE — Patient Instructions (Addendum)
For hx of gerd and recent abdomen pain will get cbc and cmp today.  Continue omeprazole and will add famotadine to see if this helps.  You do have probable hydrocele and possible hernia. Will [place] of scrotum today or tomorrow at latest. Will refer to urologist.  If any severe abdomen pain, scrotum pain or groin pain then ED evaluation.  For rt shoulder pain/tricep area pain will get shoulder xray. Rx meloxicam 7. 5 mg daily. Short course tramadol to use if needed. Rx advisement.  Follow up 7 days or as needed

## 2019-07-07 ENCOUNTER — Other Ambulatory Visit: Payer: Self-pay

## 2019-07-07 ENCOUNTER — Encounter (HOSPITAL_BASED_OUTPATIENT_CLINIC_OR_DEPARTMENT_OTHER): Payer: Self-pay

## 2019-07-07 ENCOUNTER — Ambulatory Visit (HOSPITAL_BASED_OUTPATIENT_CLINIC_OR_DEPARTMENT_OTHER)
Admission: RE | Admit: 2019-07-07 | Discharge: 2019-07-07 | Disposition: A | Discharge status: Home / Self Care | Payer: BC Managed Care – PPO | Source: Ambulatory Visit | Attending: Medical | Admitting: Medical

## 2019-07-07 ENCOUNTER — Telehealth: Payer: Self-pay | Admitting: Medical

## 2019-07-07 DIAGNOSIS — R1032 Left lower quadrant pain: Secondary | ICD-10-CM | POA: Diagnosis present

## 2019-07-07 DIAGNOSIS — K4090 Unilateral inguinal hernia, without obstruction or gangrene, not specified as recurrent: Secondary | ICD-10-CM

## 2019-07-07 DIAGNOSIS — N432 Other hydrocele: Secondary | ICD-10-CM | POA: Insufficient documentation

## 2019-07-07 NOTE — Telephone Encounter (Signed)
Referral to general surgeon placed to evaluated probable hernia.

## 2019-07-20 ENCOUNTER — Encounter: Payer: Self-pay | Admitting: Medical

## 2019-07-28 ENCOUNTER — Encounter: Payer: Self-pay | Admitting: Medical

## 2020-10-08 IMAGING — DX DG SHOULDER 2+V*R*
3 series · 3 of 3 positions shown · non-contrast
Comparison: None.

CLINICAL DATA: Right shoulder and upper triceps pain 3-4 days. No
injury.

EXAM:
RIGHT SHOULDER - 2+ VIEW

[shoulder grashey]
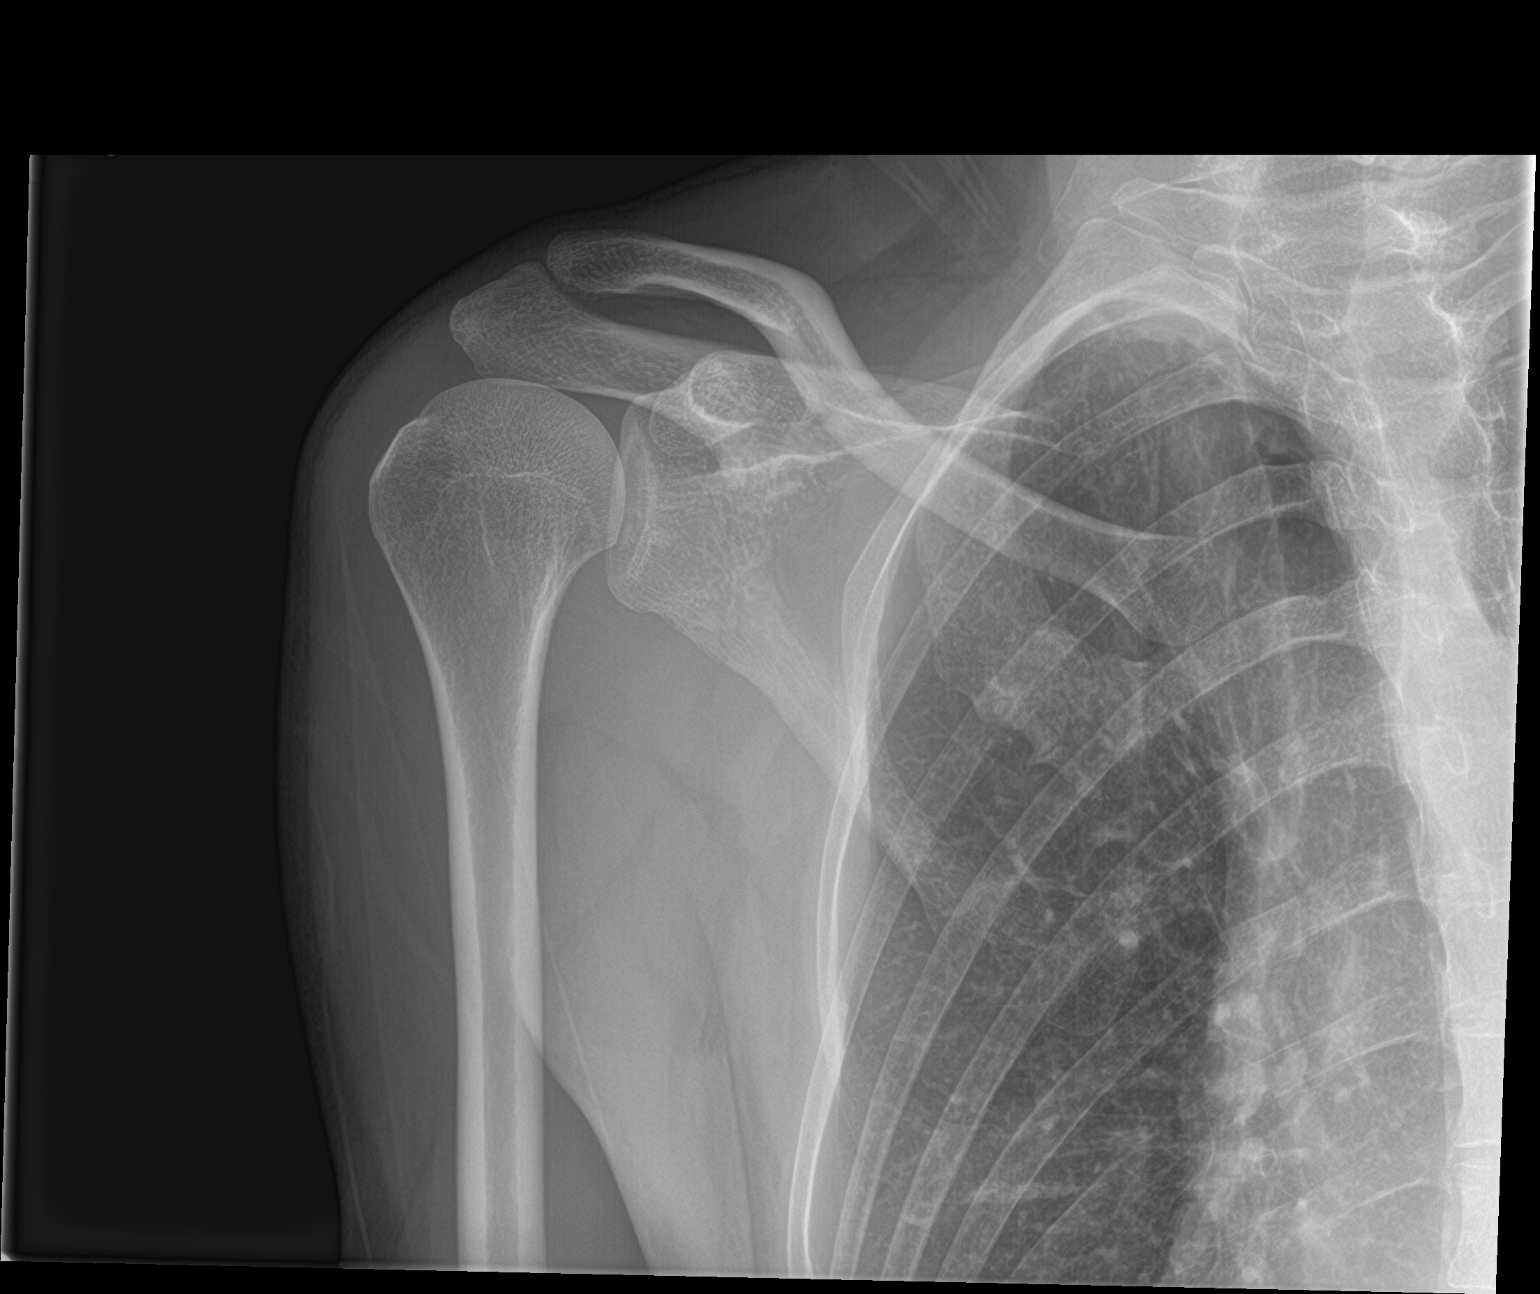

[shoulder y view]
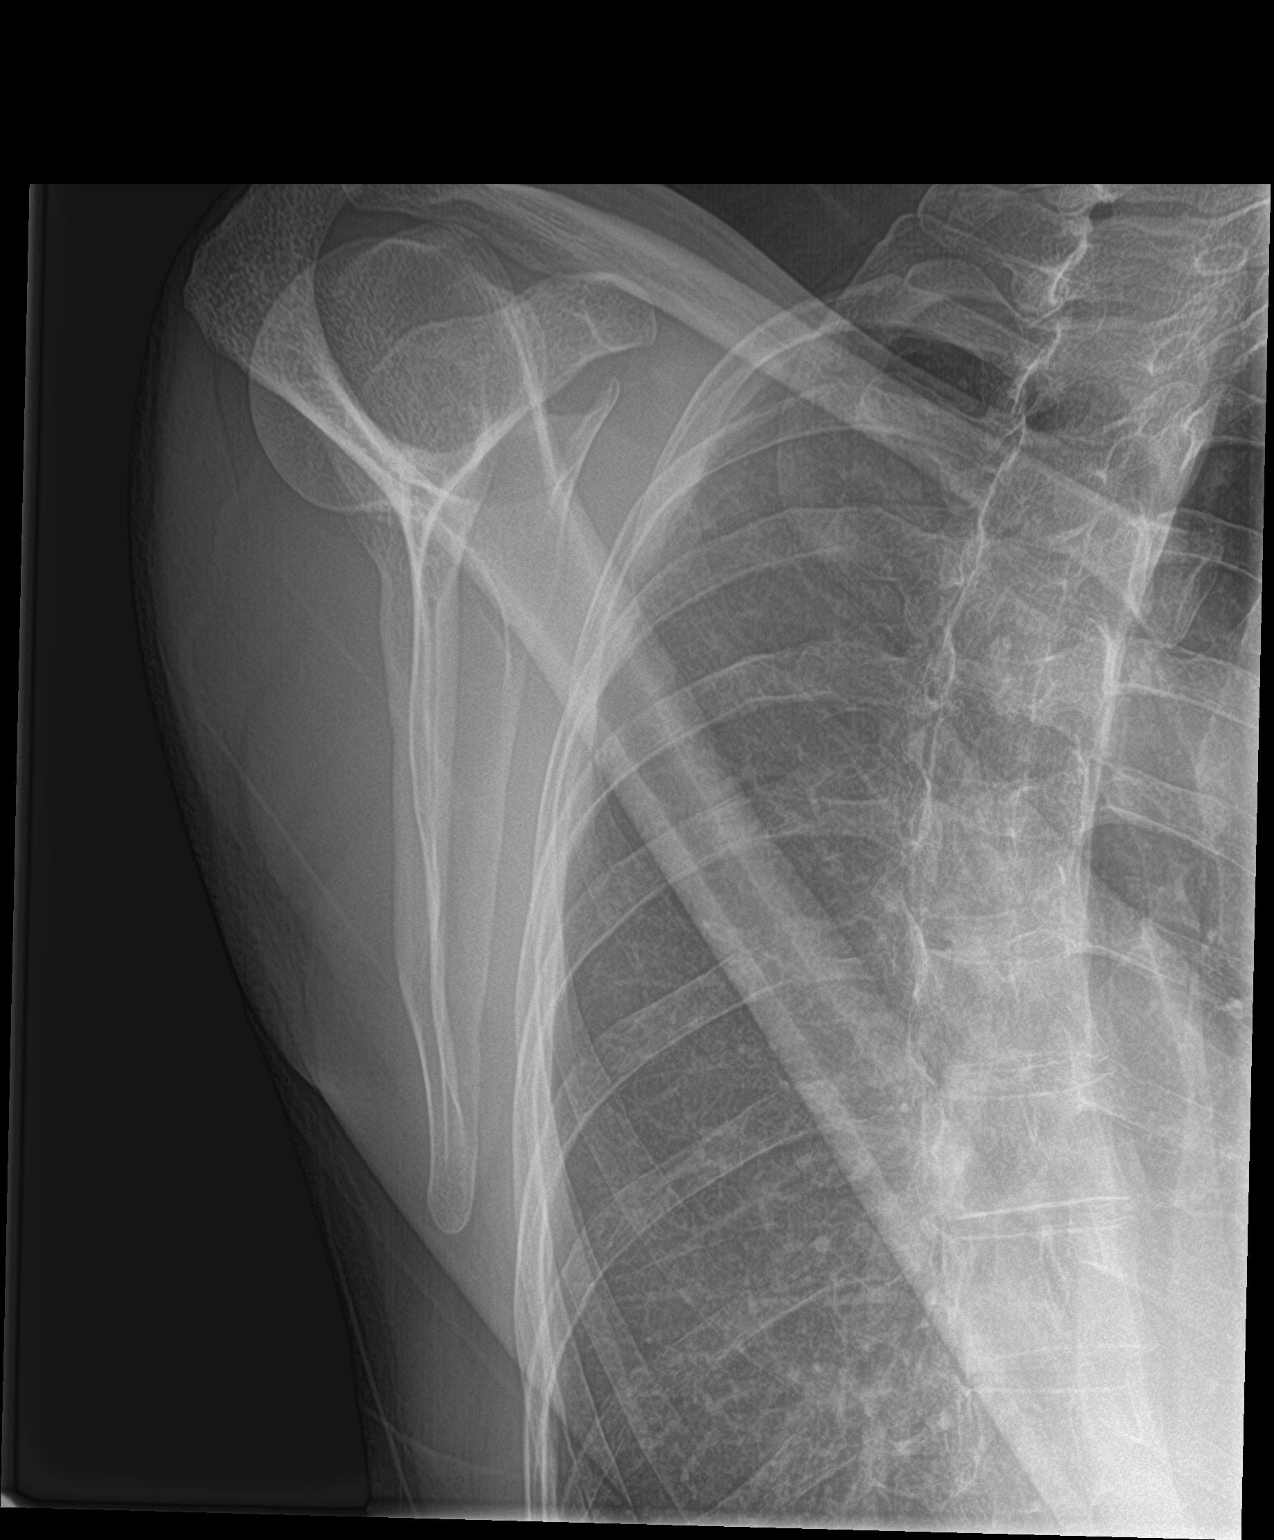

[shoulder axillary]
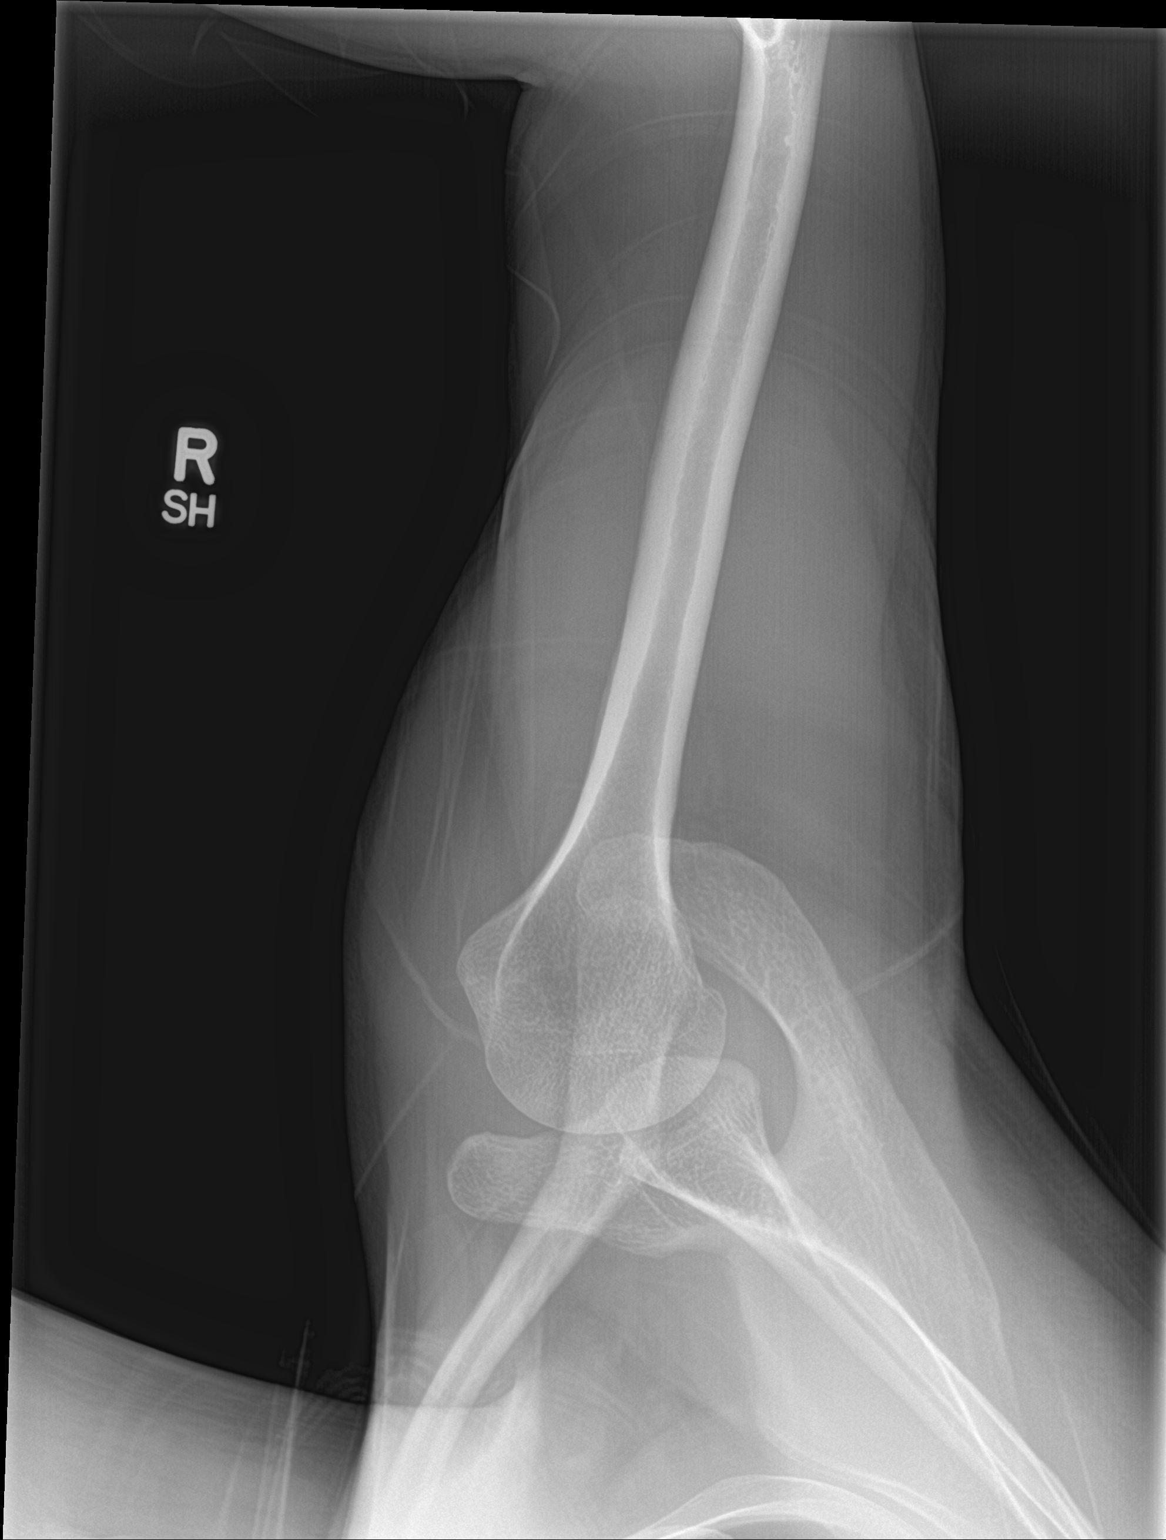

[3 of 3 positions shown; findings below may reference images not displayed]

FINDINGS: There is no evidence of fracture or dislocation. There is no
evidence of arthropathy or other focal bone abnormality. Soft
tissues are unremarkable.
IMPRESSION: Negative.

## 2020-10-12 IMAGING — US US SCROTUM W/ DOPPLER COMPLETE
1 series · 13 of 25 positions shown · non-contrast
Comparison: No prior.

CLINICAL DATA: Hydrocele. History of left scrotal mass. Left
inguinal pain.

EXAM:
SCROTAL ULTRASOUND
DOPPLER ULTRASOUND OF THE TESTICLES
TECHNIQUE: Complete ultrasound examination of the testicles, epididymis, and
other scrotal structures was performed. Color and spectral Doppler
ultrasound were also utilized to evaluate blood flow to the
testicles.

[Series 1: us scrotum w/ doppler complete · 83 acquisitions, 13 frames shown]
[im 1/83]
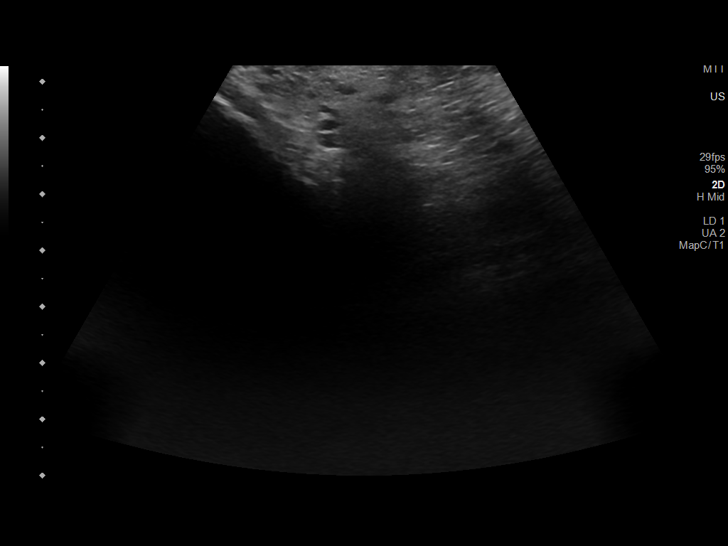
[im 7/83]
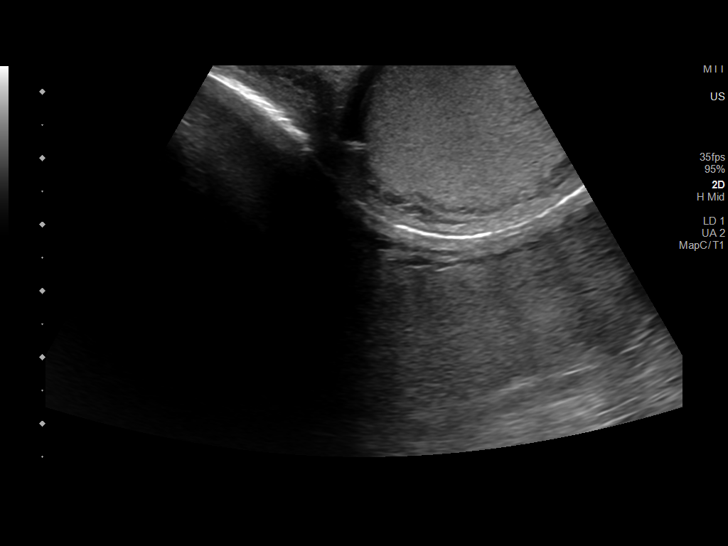
[im 14/83]
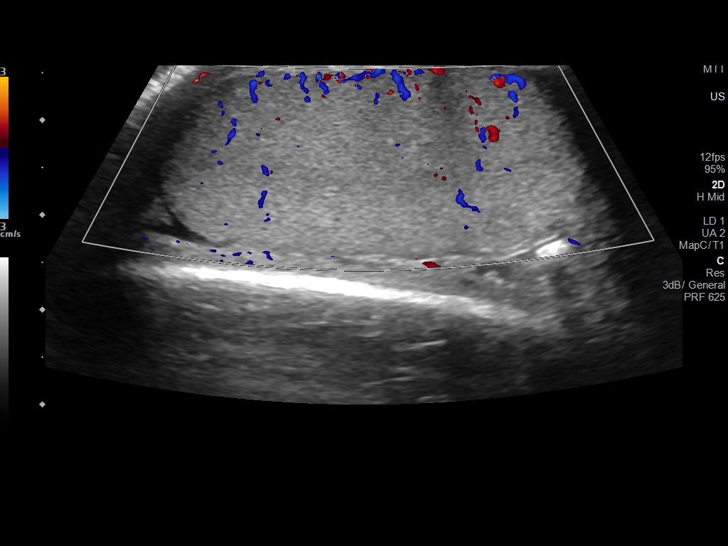
[im 21/83]
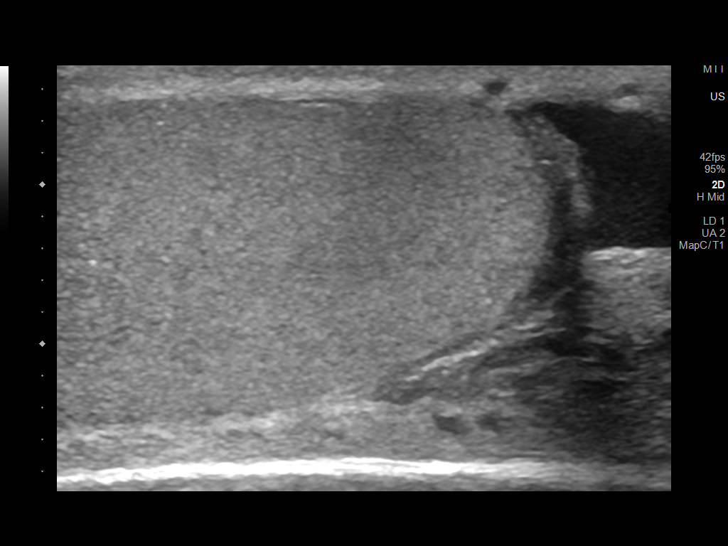
[im 28/83]
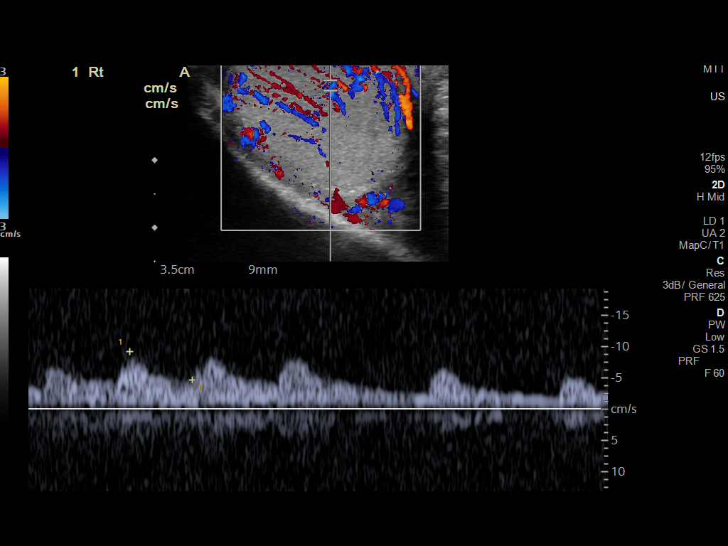
[im 35/83]
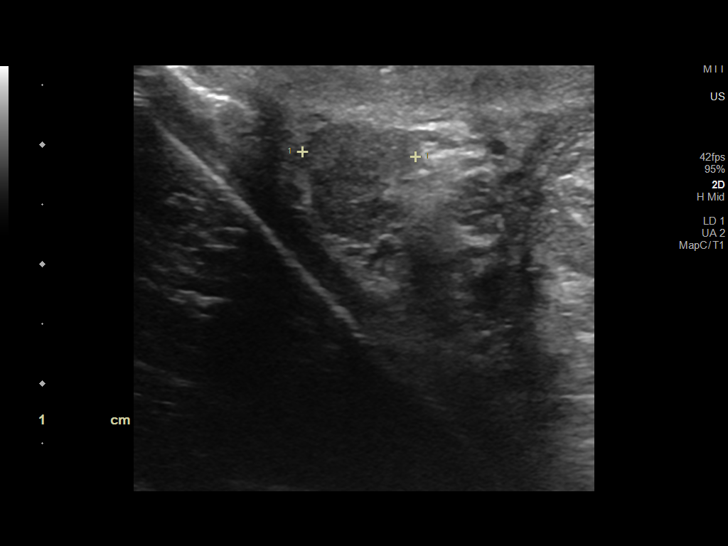
[im 42/83]
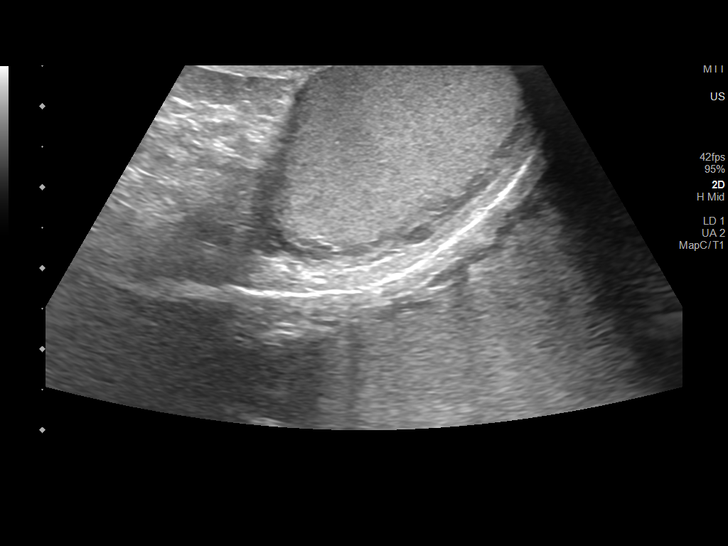
[im 48/83]
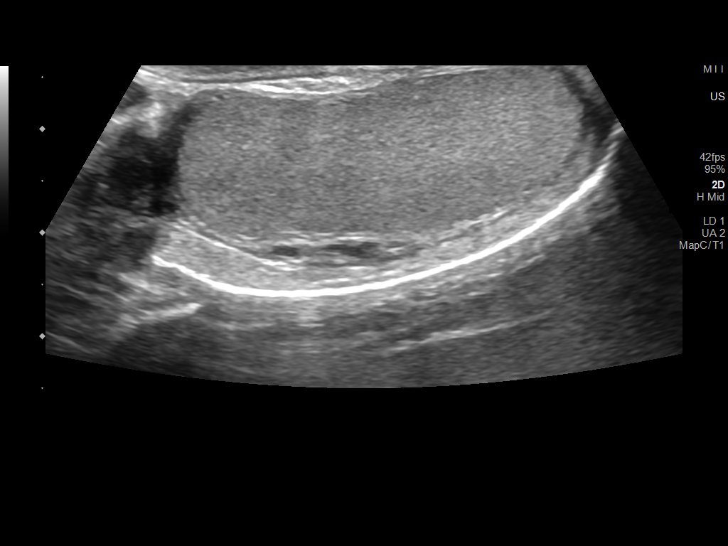
[im 55/83]
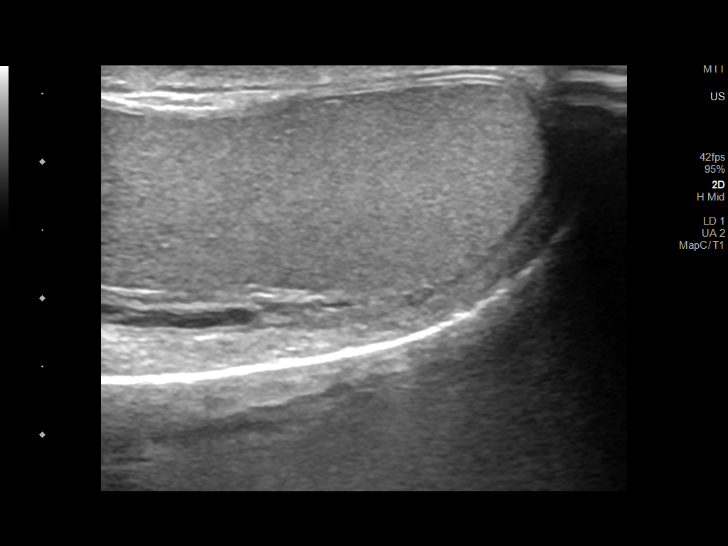
[im 62/83]
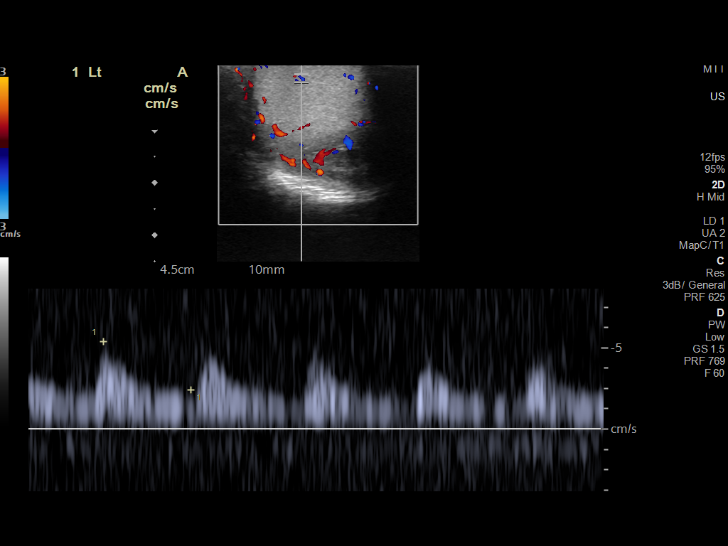
[im 69/83]
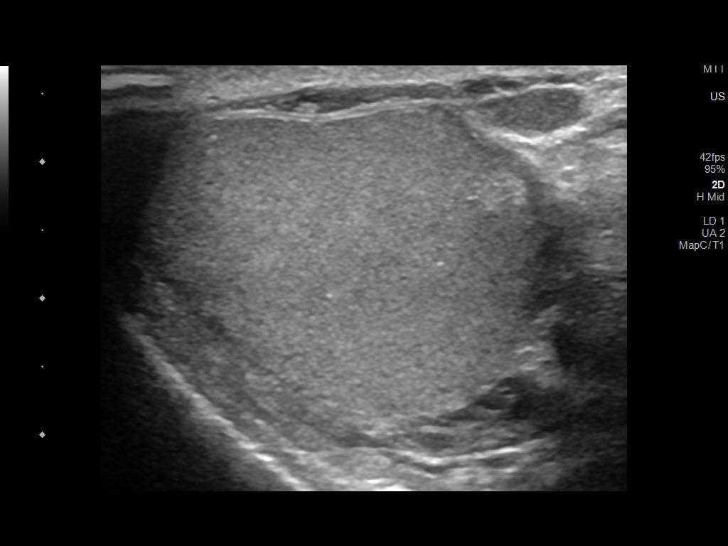
[im 76/83]
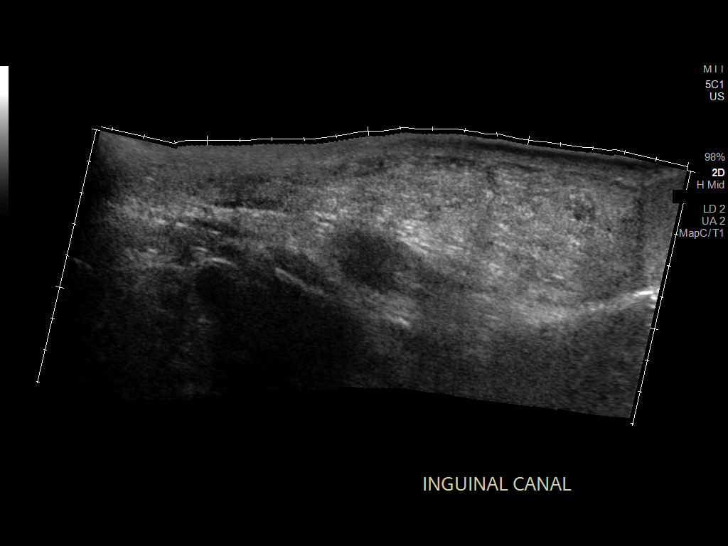
[im 83/83]
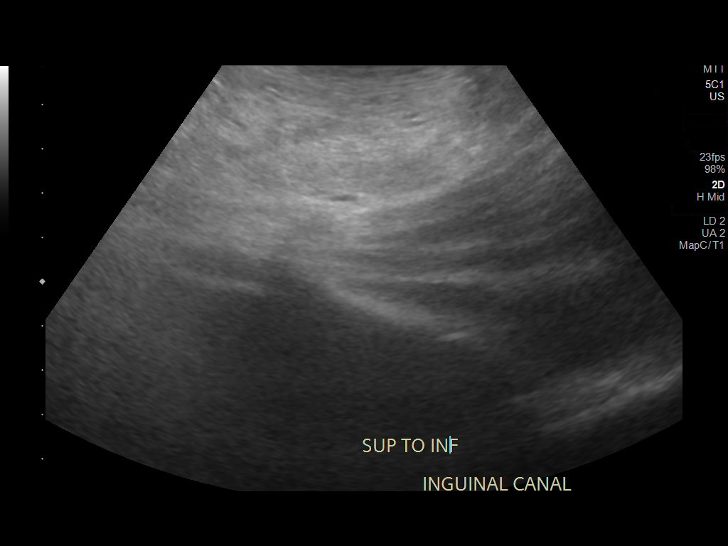

[13 of 25 positions shown; findings below may reference images not displayed]

FINDINGS: Right testicle

Measurements: 4.5 x 1.9 x 2.9 cm. No mass or microlithiasis
visualized.

Left testicle

Measurements: 4.0 x 1.6 x 3.5 cm. No mass or microlithiasis
visualized.

Right epididymis:  Normal in size and appearance.

Left epididymis:  Normal in size and appearance.

Hydrocele:  None visualized.

Varicocele:  None visualized.

Pulsed Doppler interrogation of both testes demonstrates normal low
resistance arterial and venous waveforms bilaterally.

A prominent up to 11.5 cm hyperechoic mass is noted in the left
inguinal canal/left upper scrotum. This may represent a hernia. No
bowel peristalsis noted.
IMPRESSION: A prominent up to 11.5 cm hyperechoic mass in the left inguinal
canal/left upper scrotum. This may represent a hernia. No bowel
peristalsis noted.

## 2024-03-31 ENCOUNTER — Encounter: Payer: Self-pay | Admitting: Internal Medicine

## 2024-03-31 ENCOUNTER — Ambulatory Visit: Payer: Self-pay | Admitting: Internal Medicine

## 2024-03-31 VITALS — BP 128/90 | HR 92 | Temp 98.4°F | Resp 18 | Ht 66.0 in | Wt 149.4 lb

## 2024-03-31 DIAGNOSIS — K047 Periapical abscess without sinus: Secondary | ICD-10-CM | POA: Insufficient documentation

## 2024-03-31 DIAGNOSIS — Z6824 Body mass index (BMI) 24.0-24.9, adult: Secondary | ICD-10-CM

## 2024-03-31 MED ORDER — CLINDAMYCIN HCL 300 MG PO CAPS: 300.0000 mg | ORAL_CAPSULE | Freq: Three times a day (TID) | ORAL | 0 refills | Status: AC

## 2024-03-31 MED ORDER — DICLOFENAC SODIUM 75 MG PO TBEC: 75.0000 mg | DELAYED_RELEASE_TABLET | Freq: Two times a day (BID) | ORAL | 0 refills | Status: AC

## 2024-03-31 NOTE — Assessment & Plan Note (Signed)
 I will start him on clindamycin  300 mg 3 times a day for 7 days.  I will also give him diclofenac  sodium twice a day for 7 days.  He will not combine with meloxicam  or ibuprofen with diclofenac  sodium.  He needs his tooth to be removed.  He will follow-up with dentist.

## 2024-03-31 NOTE — Progress Notes (Signed)
   Acute Office Visit  Subjective:     Patient ID: Patrick Fernandez, male    DOB: [birth year], 38 y.o.   MRN: [id number]  Chief Complaint  Patient presents with   Dental Pain    New patient    Dental Pain    Patient is in today for severe pain in his right lower teeth. He has dental abscess and was given clindamycin  and ibuprofen 800 mg three time a day in May 2025 by his dentist. He did not have insurance and he could not have his teeth pulled out because of lack of money.  He says that his father is helping him.  He started having pain and swelling in his right lower gum again.  He says that for last 2 days he is hurting so much that he cannot do anything.  His dentist is out of town.  He is here to see if he can get antibiotic and anti-inflammatory medicine.  He says that he has been taking Tylenol and ibuprofen but that does not help.  He also could not take the amoxicillin because he developed hives.  Review of Systems  Constitutional: Negative.   HENT:         Right lower tooth pain  Respiratory: Negative.    Cardiovascular: Negative.         Objective:    BP (!) 128/90   Pulse 92   Temp 98.4 F (36.9 C)   Resp 18   Ht 5' 6 (1.676 m)   Wt 149 lb 6 oz (67.8 kg)   SpO2 98%   BMI 24.11 kg/m    Physical Exam Constitutional:      Appearance: Normal appearance.  HENT:     Mouth/Throat:     Comments: He has dental caries and right frontal teeth gum is swollen and hyperemic. Neurological:     Mental Status: He is alert.     No results found for any visits on 03/31/24.      Assessment & Plan:   Problem List Items Addressed This Visit       Digestive   Tooth abscess - Primary   I will start him on clindamycin  300 mg 3 times a day for 7 days.  I will also give him diclofenac  sodium twice a day for 7 days.  He will not combine with meloxicam  or ibuprofen with diclofenac  sodium.  He needs his tooth to be removed.  He will follow-up with dentist.       No orders  of the defined types were placed in this encounter.   No follow-ups on file.  Roetta Dare, MD
